# Patient Record
Sex: Male | Born: 1990 | Race: White | Hispanic: No | Marital: Single | Smoking: Current every day smoker
Health system: Southern US, Community
[De-identification: ages and names within clinical notes are randomized; demographics above are authoritative.]

## PROBLEM LIST (undated history)

## (undated) DIAGNOSIS — G540 Brachial plexus disorders: Secondary | ICD-10-CM

---

## 2016-01-06 ENCOUNTER — Encounter (HOSPITAL_COMMUNITY): Payer: Self-pay

## 2016-01-06 ENCOUNTER — Emergency Department (HOSPITAL_COMMUNITY)
Admission: EM | Admit: 2016-01-06 | Discharge: 2016-01-06 | Disposition: A | Discharge status: Home / Self Care | Payer: BLUE CROSS/BLUE SHIELD | Attending: Emergency Medicine | Admitting: Emergency Medicine

## 2016-01-06 DIAGNOSIS — Y998 Other external cause status: Secondary | ICD-10-CM | POA: Diagnosis not present

## 2016-01-06 DIAGNOSIS — X58XXXA Exposure to other specified factors, initial encounter: Secondary | ICD-10-CM | POA: Diagnosis not present

## 2016-01-06 DIAGNOSIS — Y9367 Activity, basketball: Secondary | ICD-10-CM | POA: Insufficient documentation

## 2016-01-06 DIAGNOSIS — Y9289 Other specified places as the place of occurrence of the external cause: Secondary | ICD-10-CM | POA: Insufficient documentation

## 2016-01-06 DIAGNOSIS — F17210 Nicotine dependence, cigarettes, uncomplicated: Secondary | ICD-10-CM | POA: Insufficient documentation

## 2016-01-06 DIAGNOSIS — S39012A Strain of muscle, fascia and tendon of lower back, initial encounter: Secondary | ICD-10-CM | POA: Diagnosis not present

## 2016-01-06 DIAGNOSIS — M545 Low back pain: Secondary | ICD-10-CM | POA: Diagnosis present

## 2016-01-06 MED ORDER — CYCLOBENZAPRINE HCL 10 MG PO TABS
5.0000 mg | ORAL_TABLET | Freq: Once | ORAL | Status: AC
  Administered 2016-01-06: 5 mg via ORAL
  Filled 2016-01-06: qty 1

## 2016-01-06 MED ORDER — KETOROLAC TROMETHAMINE 30 MG/ML IJ SOLN
30.0000 mg | Freq: Once | INTRAMUSCULAR | Status: AC
  Administered 2016-01-06: 30 mg via INTRAMUSCULAR
  Filled 2016-01-06: qty 1

## 2016-01-06 MED ORDER — CYCLOBENZAPRINE HCL 5 MG PO TABS: 5.0000 mg | ORAL_TABLET | Freq: Three times a day (TID) | ORAL | Status: AC | PRN

## 2016-01-06 MED ORDER — NAPROXEN 500 MG PO TABS: 500.0000 mg | ORAL_TABLET | Freq: Two times a day (BID) | ORAL | Status: AC

## 2016-01-06 NOTE — ED Notes (Signed)
Pt states he was playing basketball yesterday and since has had lower back pain.

## 2016-01-06 NOTE — ED Provider Notes (Signed)
CSN: [id number]     Arrival date & time 01/06/16  0311 History  By signing my name below, I, Marisue Humble, attest that this documentation has been prepared under the direction and in the presence of Shon Baton, MD . Electronically Signed: Marisue Humble, Scribe. 01/06/2016. 3:48 AM.    Chief Complaint  Patient presents with  . Back Pain    Lower    The history is provided by the patient. No language interpreter was used.   HPI Comments:  Aaron Kramer is a 25 y.o. male who presents to the Emergency Department complaining of diffuse, 10/10 lower back pain and spasms onset yesterday while playing basketball. He notes pain radiates to right leg. Pt has taken Ibuprofen with no relief. Pt reports previous injury to right lower back.  Pt denies urinary diffiuclty, weakness in legs, fevers, steroid use, recent drug use, or major medical problems.  History reviewed. No pertinent past medical history. History reviewed. No pertinent past surgical history. No family history on file. Social History  Substance Use Topics  . Smoking status: Current Every Day Smoker -- 0.50 packs/day    Types: Cigarettes  . Smokeless tobacco: None  . Alcohol Use: Yes    Review of Systems  Constitutional: Negative for fever.  Genitourinary: Negative for difficulty urinating.  Musculoskeletal: Positive for back pain.  Neurological: Negative for weakness.  All other systems reviewed and are negative.   Allergies  Review of patient's allergies indicates no known allergies.  Home Medications   Prior to Admission medications   Medication Sig Start Date End Date Taking? Authorizing Provider  cyclobenzaprine (FLEXERIL) 5 MG tablet Take 1 tablet (5 mg total) by mouth 3 (three) times daily as needed for muscle spasms. 01/06/16   Shon Baton, MD  naproxen (NAPROSYN) 500 MG tablet Take 1 tablet (500 mg total) by mouth 2 (two) times daily. 01/06/16   Shon Baton, MD   BP 122/84 mmHg  Pulse 69   Temp(Src) 97.9 F (36.6 C) (Oral)  Resp 20  Ht  (1.803 m)  Wt 190 lb (86.183 kg)  BMI 26.51 kg/m2  SpO2 100% Physical Exam  Constitutional: He is oriented to person, place, and time. He appears well-developed and well-nourished. No distress.  HENT:  Head: Normocephalic and atraumatic.  Cardiovascular: Normal rate, regular rhythm and normal heart sounds.   No murmur heard. Pulmonary/Chest: Effort normal and breath sounds normal. No respiratory distress. He has no wheezes.  Abdominal: Soft. Bowel sounds are normal. There is no tenderness. There is no rebound.  Musculoskeletal: He exhibits no edema.  No midline tenderness, spasm noted over the lower back and paraspinal muscle region  Neurological: He is alert and oriented to person, place, and time.  Father 5 strength bilateral lower extremities, normal reflexes  Skin: Skin is warm and dry.  Psychiatric: He has a normal mood and affect.  Nursing note and vitals reviewed.   ED Course  Procedures  DIAGNOSTIC STUDIES:  Oxygen Saturation is 99% on RA, normal by my interpretation.    COORDINATION OF CARE:  3:47 AM Will administer muscle relaxer and anti-inflammitory. Discussed treatment plan with pt at bedside and pt agreed to plan.  Labs Review Labs Reviewed - No data to display  Imaging Review No results found. I have personally reviewed and evaluated these images and lab results as part of my medical decision-making.   EKG Interpretation None      MDM   Final diagnoses:  Lumbosacral strain, initial  encounter    Patient presents with back pain. Nontoxic on exam. No signs or symptoms of cauda equina. No additional red flags of back pain. He has tenderness and spasm over the lower muscular sure of his lumbar spine. Patient was given Flexeril and Toradol. Suspect lumbosacral strain. No indication for imaging at this time. Discharge home with naproxen and Flexeril.  After history, exam, and medical workup I feel the  patient has been appropriately medically screened and is safe for discharge home. Pertinent diagnoses were discussed with the patient. Patient was given return precautions.  I personally performed the services described in this documentation, which was scribed in my presence. The recorded information has been reviewed and is accurate.     Shon Baton, MD 01/06/16 [phone]

## 2016-01-06 NOTE — Discharge Instructions (Signed)

## 2016-05-14 ENCOUNTER — Emergency Department (HOSPITAL_COMMUNITY): Payer: BLUE CROSS/BLUE SHIELD

## 2016-05-14 ENCOUNTER — Encounter (HOSPITAL_COMMUNITY): Payer: Self-pay

## 2016-05-14 ENCOUNTER — Emergency Department (HOSPITAL_COMMUNITY)
Admission: EM | Admit: 2016-05-14 | Discharge: 2016-05-14 | Disposition: A | Discharge status: Home / Self Care | Payer: BLUE CROSS/BLUE SHIELD | Attending: Emergency Medicine | Admitting: Emergency Medicine

## 2016-05-14 DIAGNOSIS — R51 Headache: Secondary | ICD-10-CM | POA: Diagnosis present

## 2016-05-14 DIAGNOSIS — R791 Abnormal coagulation profile: Secondary | ICD-10-CM | POA: Diagnosis not present

## 2016-05-14 DIAGNOSIS — F17210 Nicotine dependence, cigarettes, uncomplicated: Secondary | ICD-10-CM | POA: Diagnosis not present

## 2016-05-14 DIAGNOSIS — Z79899 Other long term (current) drug therapy: Secondary | ICD-10-CM | POA: Diagnosis not present

## 2016-05-14 DIAGNOSIS — R519 Headache, unspecified: Secondary | ICD-10-CM

## 2016-05-14 HISTORY — DX: Brachial plexus disorders: G54.0

## 2016-05-14 LAB — COMPREHENSIVE METABOLIC PANEL
ALBUMIN: 4.2 g/dL (ref 3.5–5.0)
ALK PHOS: 63 U/L (ref 38–126)
ALT: 17 U/L (ref 17–63)
AST: 20 U/L (ref 15–41)
Anion gap: 8 (ref 5–15)
BILIRUBIN TOTAL: 0.7 mg/dL (ref 0.3–1.2)
BUN: 12 mg/dL (ref 6–20)
CO2: 24 mmol/L (ref 22–32)
CREATININE: 0.9 mg/dL (ref 0.61–1.24)
Calcium: 9.5 mg/dL (ref 8.9–10.3)
Chloride: 105 mmol/L (ref 101–111)
GFR calc Af Amer: >60 mL/min (ref >60)
GFR calc non Af Amer: >60 mL/min (ref >60)
GLUCOSE: 84 mg/dL (ref 65–99)
Potassium: 3.7 mmol/L (ref 3.5–5.1)
SODIUM: 137 mmol/L (ref 135–145)
Total Protein: 7 g/dL (ref 6.5–8.1)

## 2016-05-14 LAB — I-STAT CHEM 8, ED
BUN: 12 mg/dL (ref 6–20)
CREATININE: 0.9 mg/dL (ref 0.61–1.24)
Calcium, Ion: 1.13 mmol/L (ref 1.13–1.30)
Chloride: 103 mmol/L (ref 101–111)
Glucose, Bld: 86 mg/dL (ref 65–99)
HEMATOCRIT: 46 % (ref 39.0–52.0)
HEMOGLOBIN: 15.6 g/dL (ref 13.0–17.0)
POTASSIUM: 3.7 mmol/L (ref 3.5–5.1)
Sodium: 140 mmol/L (ref 135–145)
TCO2: 25 mmol/L (ref 0–100)

## 2016-05-14 LAB — CBC
HCT: 43.2 % (ref 39.0–52.0)
HEMOGLOBIN: 15.3 g/dL (ref 13.0–17.0)
MCH: 29.9 pg (ref 26.0–34.0)
MCHC: 35.4 g/dL (ref 30.0–36.0)
MCV: 84.5 fL (ref 78.0–100.0)
PLATELETS: 217 10*3/uL (ref 150–400)
RBC: 5.11 MIL/uL (ref 4.22–5.81)
RDW: 12.2 % (ref 11.5–15.5)
WBC: 8.8 10*3/uL (ref 4.0–10.5)

## 2016-05-14 LAB — PROTIME-INR
INR: 1.1 (ref 0.00–1.49)
Prothrombin Time: 14.4 seconds (ref 11.6–15.2)

## 2016-05-14 LAB — APTT: aPTT: 30 seconds (ref 24–37)

## 2016-05-14 LAB — DIFFERENTIAL
Basophils Absolute: 0 10*3/uL (ref 0.0–0.1)
Basophils Relative: 0 %
EOS PCT: 4 %
Eosinophils Absolute: 0.4 10*3/uL (ref 0.0–0.7)
LYMPHS ABS: 2.8 10*3/uL (ref 0.7–4.0)
LYMPHS PCT: 32 %
Monocytes Absolute: 0.7 10*3/uL (ref 0.1–1.0)
Monocytes Relative: 8 %
NEUTROS ABS: 4.8 10*3/uL (ref 1.7–7.7)
NEUTROS PCT: 56 %

## 2016-05-14 LAB — I-STAT TROPONIN, ED: TROPONIN I, POC: 0 ng/mL (ref 0.00–0.08)

## 2016-05-14 MED ORDER — GABAPENTIN 100 MG PO CAPS
100.0000 mg | ORAL_CAPSULE | Freq: Three times a day (TID) | ORAL | Status: AC
Start: 2016-05-14 — End: ?

## 2016-05-14 NOTE — ED Notes (Signed)
Patient left at this time with all belongings. 

## 2016-05-14 NOTE — ED Provider Notes (Signed)
CSN: [id number]     Arrival date & time 05/14/16  1745 History   First MD Initiated Contact with Patient 05/14/16 2025     Chief Complaint  Patient presents with  . Blurred Vision  . Headache     (Consider location/radiation/quality/duration/timing/severity/associated sxs/prior Treatment) Patient is a 25 y.o. male presenting with headaches. The history is provided by the patient.  Headache Pain location:  L temporal Quality:  Dull Radiates to:  Does not radiate Severity currently:  1/10 Severity at highest:  8/10 Onset quality:  Gradual Duration:  4 days Timing:  Intermittent Progression:  Waxing and waning Chronicity:  New Similar to prior headaches: no   Associated symptoms: no abdominal pain, no back pain, no congestion, no cough, no diarrhea, no dizziness, no eye pain, no fever, no nausea, no neck pain, no sore throat, no vomiting and no weakness     Past Medical History  Diagnosis Date  . Thoracic outlet syndrome    History reviewed. No pertinent past surgical history. No family history on file. Social History  Substance Use Topics  . Smoking status: Current Every Day Smoker -- 0.50 packs/day    Types: Cigarettes  . Smokeless tobacco: None  . Alcohol Use: Yes     Comment: socially     Review of Systems  Constitutional: Negative for fever and chills.  HENT: Negative for congestion and sore throat.   Eyes: Positive for visual disturbance. Negative for pain.  Respiratory: Negative for cough and shortness of breath.   Cardiovascular: Negative for chest pain and palpitations.  Gastrointestinal: Negative for nausea, vomiting, abdominal pain and diarrhea.  Endocrine: Negative.   Genitourinary: Negative for flank pain.  Musculoskeletal: Negative for back pain and neck pain.  Skin: Negative for rash.  Allergic/Immunologic: Negative.   Neurological: Positive for headaches. Negative for dizziness, syncope, weakness and light-headedness.  Psychiatric/Behavioral:  Negative for confusion.      Allergies  Review of patient's allergies indicates no known allergies.  Home Medications   Prior to Admission medications   Medication Sig Start Date End Date Taking? Authorizing Provider  cyclobenzaprine (FLEXERIL) 5 MG tablet Take 1 tablet (5 mg total) by mouth 3 (three) times daily as needed for muscle spasms. 01/06/16   Shon Batonourtney F Horton, MD  naproxen (NAPROSYN) 500 MG tablet Take 1 tablet (500 mg total) by mouth 2 (two) times daily. 01/06/16   Shon Batonourtney F Horton, MD   BP 112/91 mmHg  Pulse 60  Temp(Src) 98.4 F (36.9 C) (Oral)  Resp 14  Ht 5\' 10"  (1.778 m)  Wt 86.183 kg  BMI 27.26 kg/m2  SpO2 100% Physical Exam  Constitutional: He is oriented to person, place, and time. He appears well-developed and well-nourished.  HENT:  Head: Normocephalic and atraumatic.  Eyes: Conjunctivae and EOM are normal. Pupils are equal, round, and reactive to light.  Neck: Normal range of motion. Neck supple.  Cardiovascular: Normal rate, regular rhythm, normal heart sounds and intact distal pulses.   Pulmonary/Chest: Effort normal and breath sounds normal. No respiratory distress.  Abdominal: Soft. Bowel sounds are normal. There is no tenderness.  Musculoskeletal: Normal range of motion.  Neurological: He is alert and oriented to person, place, and time. He has normal strength and normal reflexes. No cranial nerve deficit or sensory deficit. He displays a negative Romberg sign.  Normal finger to nose bilaterally.   No pronator drift bilaterally.    Skin: Skin is warm and dry.    ED Course  Procedures (including critical  care time) Labs Review Labs Reviewed  PROTIME-INR  APTT  CBC  DIFFERENTIAL  COMPREHENSIVE METABOLIC PANEL  I-STAT TROPOININ, ED  CBG MONITORING, ED  I-STAT CHEM 8, ED    Imaging Review Dg Chest 2 View  05/14/2016  CLINICAL DATA:  Blurred vision since yesterday.  Smoking history. EXAM: CHEST  2 VIEW COMPARISON:  None. FINDINGS: Heart  size is normal. Mediastinal shadows are normal. The lungs are clear. No bronchial thickening. No infiltrate, mass, effusion or collapse. Pulmonary vascularity is normal. No bony abnormality. IMPRESSION: Normal chest Electronically Signed   By: Paulina FusiMark  Shogry M.D.   On: 05/14/2016 19:04   Ct Head Wo Contrast  05/14/2016  CLINICAL DATA:  Bulging vein in the left temporal region which was painful. Blurred vision. Left eye blindness. Dizziness. EXAM: CT HEAD WITHOUT CONTRAST TECHNIQUE: Contiguous axial images were obtained from the base of the skull through the vertex without intravenous contrast. COMPARISON:  None. FINDINGS: The brain has a normal appearance without evidence of malformation, atrophy, old or acute infarction, mass lesion, hemorrhage, hydrocephalus or extra-axial collection. The calvarium is unremarkable. The paranasal sinuses, middle ears and mastoids are clear. No evidence of abnormal temporal vessel by CT. IMPRESSION: Normal head CT. No evidence of any abnormal temporal superficial vessel by CT. Electronically Signed   By: Paulina FusiMark  Shogry M.D.   On: 05/14/2016 19:02   I have personally reviewed and evaluated these images and lab results as part of my medical decision-making.   EKG Interpretation   Date/Time:  Thursday May 14 2016 18:05:54 EDT Ventricular Rate:  80 PR Interval:  162 QRS Duration: 84 QT Interval:  356 QTC Calculation: 410 R Axis:   83 Text Interpretation:  Normal sinus rhythm Normal ECG No old tracing to  compare Confirmed by [facility]  MD, MARTHA [phone]) on 05/14/2016 8:47:41 PM      MDM   Final diagnoses:  None    The pt is a 25 yo male with no hx of migraines presenting with new onset left sided headaches associated with left sided visual disturbances.  Pt reports onset of throbbing left sided HA with visual scotomas then blurring of left eye.   Ddx migraine, temporal arteritis, subclavian steal syndrome, retinal detachment  Pt with CBC, BMP unremarkable.  Neuro  exam WNL.  Head CT without acute abnormalities.  Doubt acute stroke and stuttering TIA.  No palpable artery and young age make temporal arteritis less likely.  Hx of thoracic outlet sx but on provocative testing of left arm no reproduction of sx to indicate subclavian steal as etiology. Full visual fields and low suspicion for detachment as sx resolve over time.  Discussed with neurology who agree sx likely migrainous in nature and recommended gabapentin for 3 days with neurology f/u.  Discussed with pt and agrees.   Labs were viewed by myself  incorporated into medical decision making.  Discussed pertinent finding with patient or caregiver prior to discharge with no further questions.  Immediate return precautions given and understood.  Medical decision making supervised by my attending Dr. Karma GanjaLinker.   Tery SanfilippoMatthew Endiya Klahr, MD PGY-3 Emergency Medicine      Tery SanfilippoMatthew Amani Nodarse, MD 05/16/16 [id number]  Jerelyn ScottMartha Linker, MD 05/19/16 [phone]

## 2016-05-14 NOTE — ED Notes (Signed)
Spoke with MD Silverio LayYao about patient's symptoms and care

## 2016-05-14 NOTE — ED Notes (Addendum)
Per Pt, Pt stated that last night, pt noted a left temporal vein bulging with some pain. Pt had episode of blurred vision that led to blindness in the left eye. Pt reports no hx of the same. Pt has had multiple episodes of this throughout the day along with dizziness. Complains of feeling "fog headed" throughout the day. Pt was diagnosed Thoracic Outlet Syndrome four years ago with no surgeries completed.

## 2016-12-14 ENCOUNTER — Ambulatory Visit: Payer: BLUE CROSS/BLUE SHIELD | Admitting: Physician Assistant

## 2016-12-29 IMAGING — CT CT HEAD W/O CM
3 of 4 series · 18 of 47 positions shown, 21 images · non-contrast
Comparison: None.

CLINICAL DATA: Bulging vein in the left temporal region which was
painful. Blurred vision. Left eye blindness. Dizziness.

EXAM:
CT HEAD WITHOUT CONTRAST
TECHNIQUE: Contiguous axial images were obtained from the base of the skull
through the vertex without intravenous contrast.

[Series 201: head w/o, idose (1) · axial · non-contrast · 0.40mm/px · z∈[+87,+217]mm · 12 of 32 slices shown, 15 images]
[im 3/32  brain]
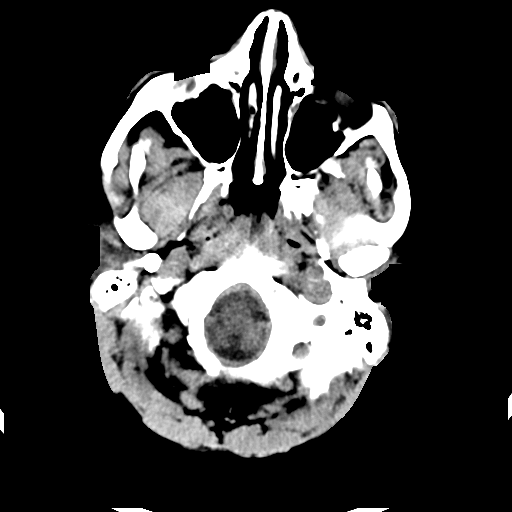
[im 3/32  bone]
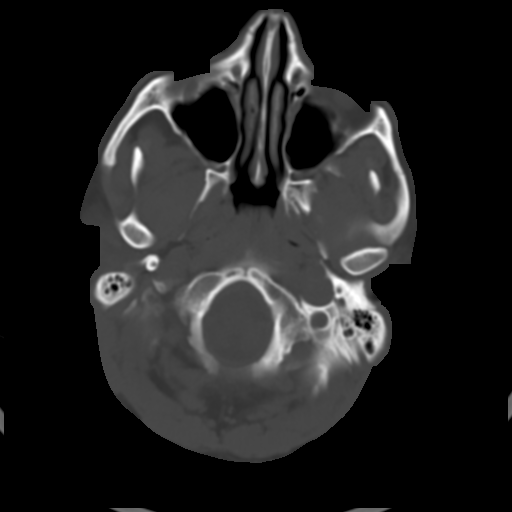
[im 5/32  brain]
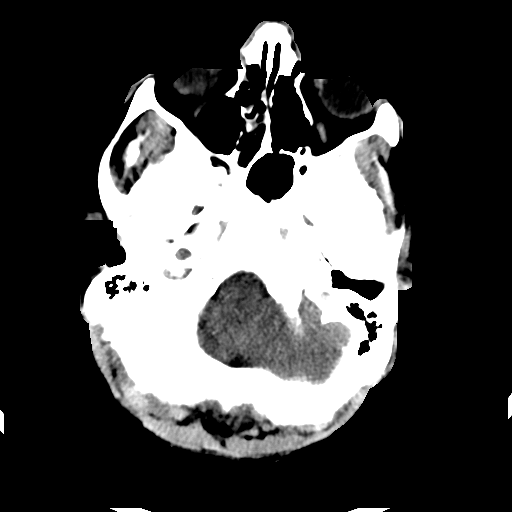
[im 7/32  brain]
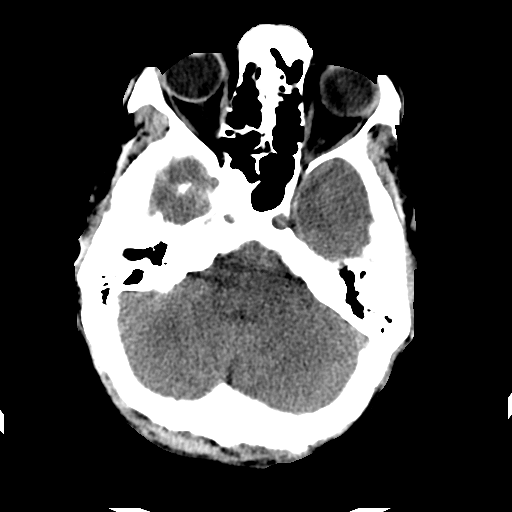
[im 9/32  brain]
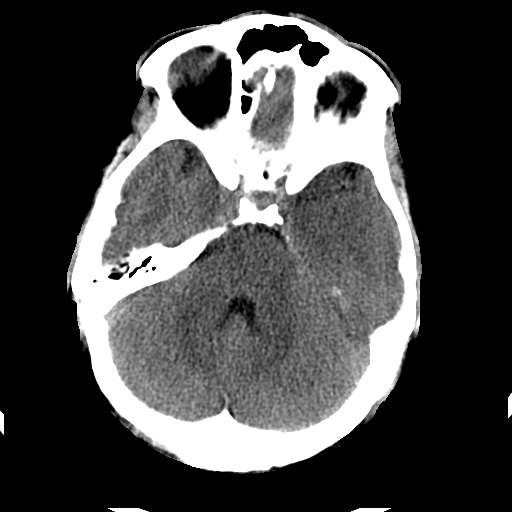
[im 12/32  brain]
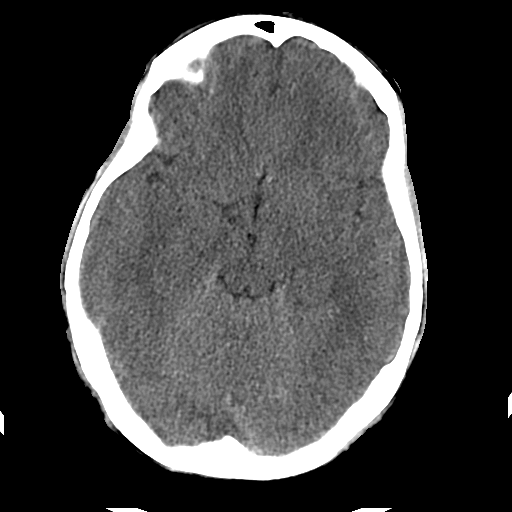
[im 12/32  bone]
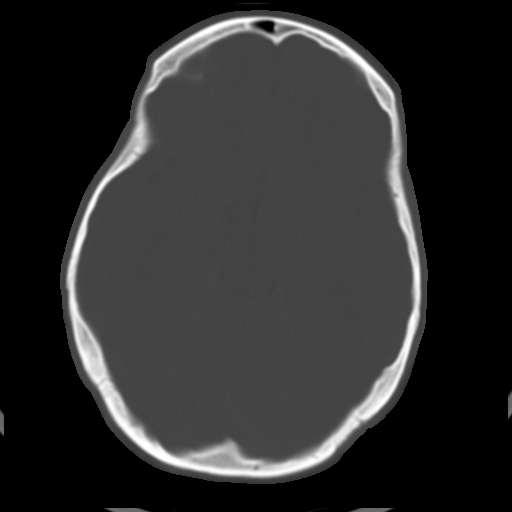
[im 14/32  brain]
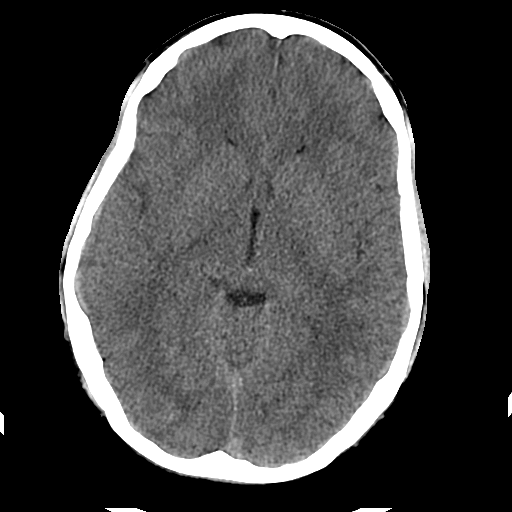
[im 18/32  brain]
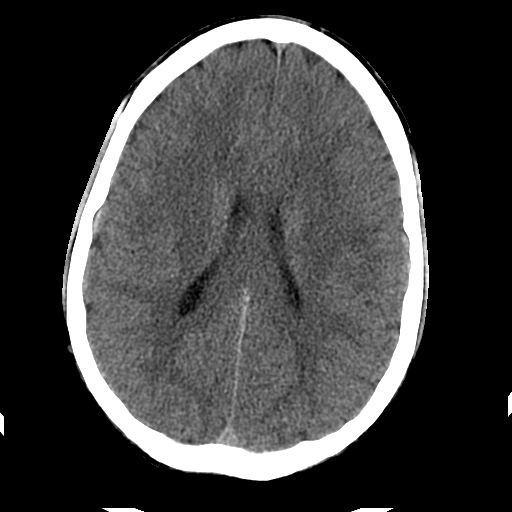
[im 20/32  brain]
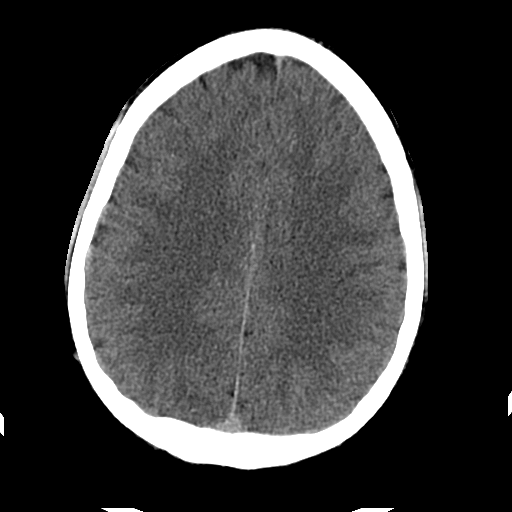
[im 23/32  brain]
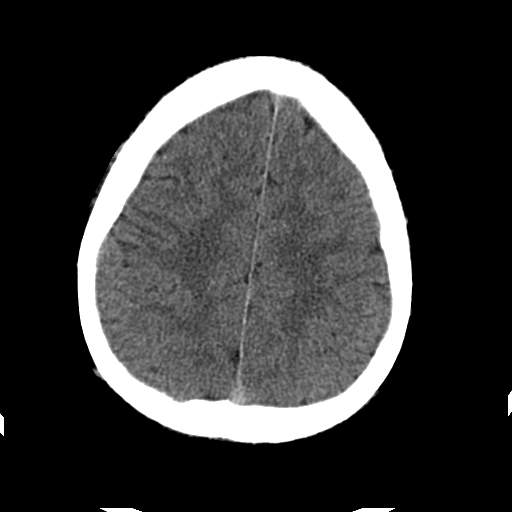
[im 23/32  bone]
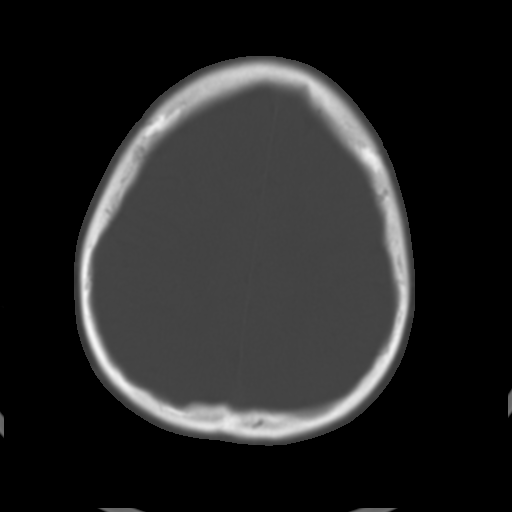
[im 25/32  brain]
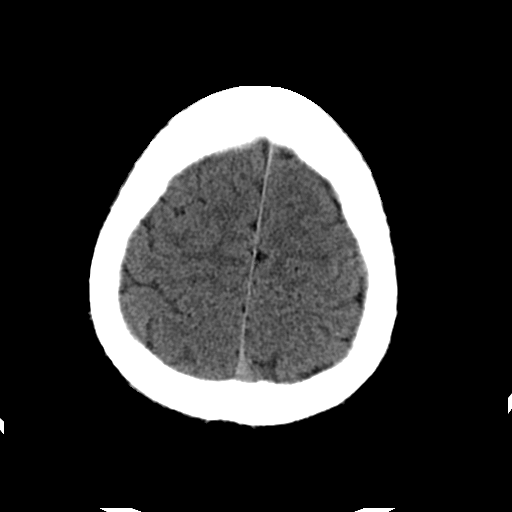
[im 27/32  brain]
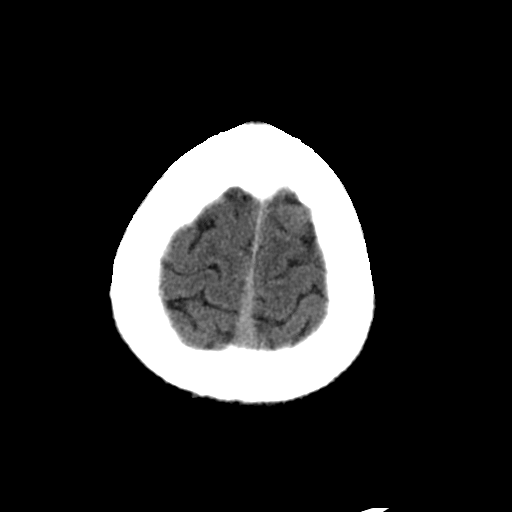
[im 29/32  brain]
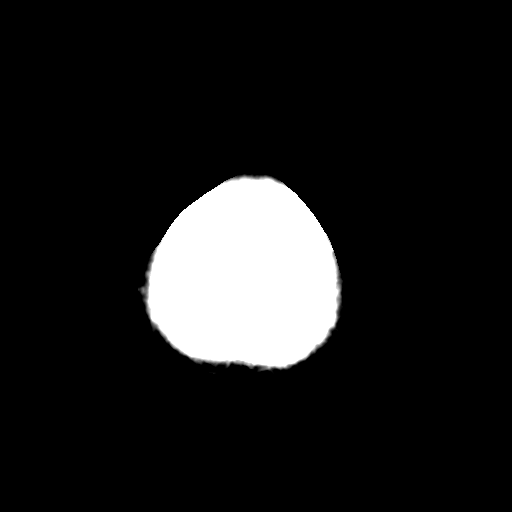

[Series 203: coronal st, idose (1) · coronal · 0.40mm/px · 3 of 67 slices shown]
[im 23/67  brain]
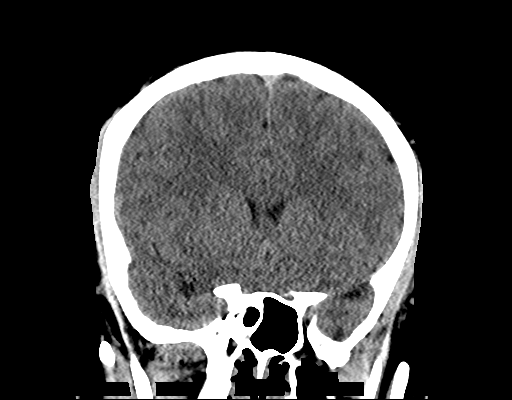
[im 30/67  brain]
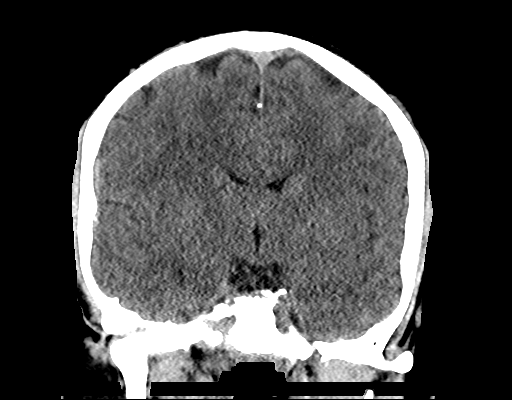
[im 37/67  brain]
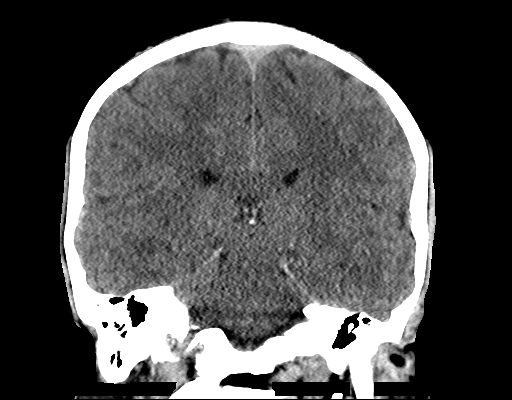

[Series 204: sagittal st, idose (1) · sagittal · 0.40mm/px · 3 of 67 slices shown]
[im 23/67  brain]
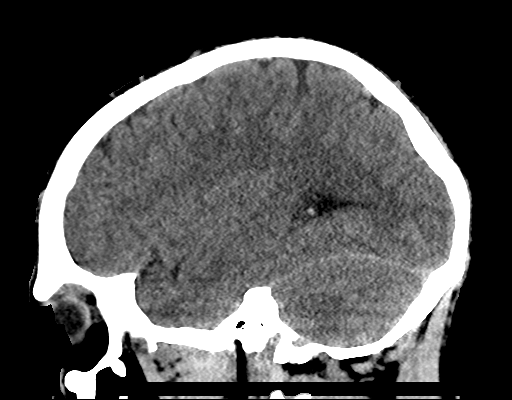
[im 34/67  brain]
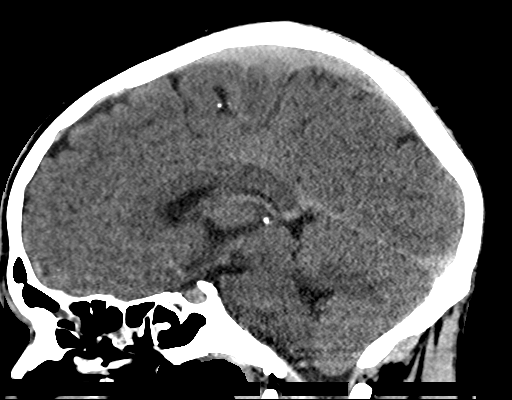
[im 45/67  brain]
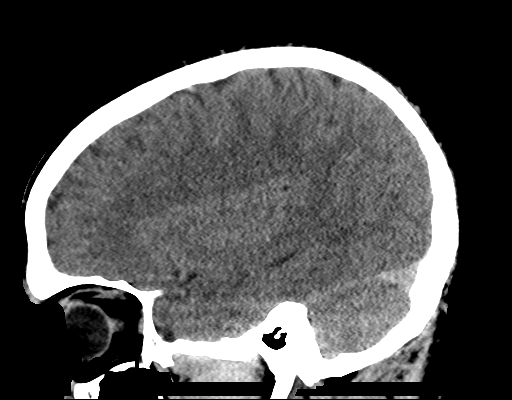

[18 of 47 positions shown; findings below may reference images not displayed]

FINDINGS: The brain has a normal appearance without evidence of malformation,
atrophy, old or acute infarction, mass lesion, hemorrhage,
hydrocephalus or extra-axial collection. The calvarium is
unremarkable. The paranasal sinuses, middle ears and mastoids are
clear. No evidence of abnormal temporal vessel by CT.
IMPRESSION: Normal head CT. No evidence of any abnormal temporal superficial
vessel by CT.

## 2018-05-10 ENCOUNTER — Ambulatory Visit (HOSPITAL_COMMUNITY): Payer: BLUE CROSS/BLUE SHIELD | Admitting: Psychiatry
